# Patient Record
Sex: Female | Born: 1990 | Race: White | Hispanic: No | Marital: Single | Smoking: Never smoker
Health system: Southern US, Community
[De-identification: ages and names within clinical notes are randomized; demographics above are authoritative.]

## PROBLEM LIST (undated history)

## (undated) DIAGNOSIS — T7840XA Allergy, unspecified, initial encounter: Secondary | ICD-10-CM

## (undated) DIAGNOSIS — I10 Essential (primary) hypertension: Secondary | ICD-10-CM

## (undated) HISTORY — PX: TONSILLECTOMY AND ADENOIDECTOMY: SUR1326

## (undated) HISTORY — PX: HERNIA REPAIR: SHX51

## (undated) HISTORY — DX: Essential (primary) hypertension: I10

## (undated) HISTORY — DX: Allergy, unspecified, initial encounter: T78.40XA

---
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
## 2016-01-21 LAB — HM PAP SMEAR

## 2016-12-02 ENCOUNTER — Ambulatory Visit (INDEPENDENT_AMBULATORY_CARE_PROVIDER_SITE_OTHER): Payer: Self-pay | Admitting: Physician Assistant

## 2016-12-02 VITALS — BP 114/84 | HR 99 | Temp 98.1°F | Resp 16 | Ht 64.0 in | Wt 283.4 lb

## 2016-12-02 DIAGNOSIS — R10812 Left upper quadrant abdominal tenderness: Secondary | ICD-10-CM

## 2016-12-02 DIAGNOSIS — F418 Other specified anxiety disorders: Secondary | ICD-10-CM

## 2016-12-02 LAB — POCT URINALYSIS DIP (MANUAL ENTRY)
Bilirubin, UA: NEGATIVE
Glucose, UA: NEGATIVE
Ketones, POC UA: NEGATIVE
Nitrite, UA: NEGATIVE
Spec Grav, UA: >=1.03
Urobilinogen, UA: 0.2
pH, UA: 5.5

## 2016-12-02 LAB — POCT CBC
Granulocyte percent: 60.8 %G (ref 37–80)
HCT, POC: 43.5 % (ref 37.7–47.9)
Hemoglobin: 15 g/dL (ref 12.2–16.2)
Lymph, poc: 3.7 — AB (ref 0.6–3.4)
MCH, POC: 27.6 pg (ref 27–31.2)
MCHC: 34.6 g/dL (ref 31.8–35.4)
MCV: 79.9 fL — AB (ref 80–97)
MID (cbc): 0.5 (ref 0–0.9)
MPV: 9 fL (ref 0–99.8)
POC Granulocyte: 6.4 (ref 2–6.9)
POC LYMPH PERCENT: 34.6 %L (ref 10–50)
POC MID %: 4.6 %M (ref 0–12)
Platelet Count, POC: 195 10*3/uL (ref 142–424)
RBC: 5.44 M/uL (ref 4.04–5.48)
RDW, POC: 12.5 %
WBC: 10.6 10*3/uL — AB (ref 4.6–10.2)

## 2016-12-02 LAB — POC MICROSCOPIC URINALYSIS (UMFC): Mucus: ABSENT

## 2016-12-02 LAB — POCT URINE PREGNANCY: Preg Test, Ur: NEGATIVE

## 2016-12-02 NOTE — Progress Notes (Signed)
12/05/2016 8:33 AM   DOB: Oct 28, 1991 / MRN: [id number]  SUBJECTIVE:  Debra Pacheco is a 26 y.o. female presenting for pain in her left lower quadrant that started 6 months ago. She also reports a lump in the skin just to the right of her umbilicus. These problems started at the same time. has roughly 1 stool daily and reports this is normal for her. She denies any acid reflux. She does have a history of HTN as a child however this resolved. She denies a personal history of diabetes. Denies difficulty swallowing.  She is sexually active with a female and is not trying to prevent pregnancy.    She has No Known Allergies.   She  has a past medical history of Hypertension.    She  reports that she has never smoked. She has never used smokeless tobacco. She reports that she does not drink alcohol or use drugs. She  has no sexual activity history on file. The patient  has a past surgical history that includes Hernia repair and Tonsillectomy and adenoidectomy.  Her family history includes Hyperlipidemia in her father and mother.  Review of Systems  Constitutional: Negative for fever.  HENT: Negative for sore throat.   Respiratory: Negative for cough.   Cardiovascular: Negative for chest pain.  Gastrointestinal: Positive for abdominal pain. Negative for blood in stool, constipation, diarrhea, heartburn, melena, nausea and vomiting.  Genitourinary: Negative for dysuria, flank pain, frequency, hematuria and urgency.  Neurological: Negative for dizziness.  Psychiatric/Behavioral: The patient is nervous/anxious.     The problem list and medications were reviewed and updated by myself where necessary and exist elsewhere in the encounter.   OBJECTIVE:  BP 114/84   Pulse 99   Temp 98.1 F (36.7 C) (Oral)   Resp 16   Ht '5\' 4"'  (1.626 m)   Wt 283 lb 6.4 oz (128.5 kg)   LMP 11/04/2016   SpO2 96%   BMI 48.65 kg/m   Physical Exam  Constitutional: She is oriented to person, place, and time. She  appears well-developed and well-nourished.  Cardiovascular: Normal rate, regular rhythm and normal heart sounds.   Pulmonary/Chest: Effort normal and breath sounds normal.  Abdominal: There is no hepatosplenomegaly, splenomegaly or hepatomegaly. There is no rigidity, no rebound, no guarding and no CVA tenderness.    Musculoskeletal: Normal range of motion.  Neurological: She is alert and oriented to person, place, and time.  Skin: Skin is warm and dry.  Psychiatric: She has a normal mood and affect.    Results for orders placed or performed in visit on 12/02/16 (from the past 72 hour(s))  CMP14+EGFR     Status: Abnormal   Collection Time: 12/02/16  3:13 PM  Result Value Ref Range   Glucose 81 65 - 99 mg/dL   BUN 12 6 - 20 mg/dL   Creatinine, Ser 0.83 0.57 - 1.00 mg/dL   GFR calc non Af Amer 97 >59 mL/min/1.73   GFR calc Af Amer 112 >59 mL/min/1.73   BUN/Creatinine Ratio 14 9 - 23   Sodium 142 134 - 144 mmol/L   Potassium 3.9 3.5 - 5.2 mmol/L   Chloride 98 96 - 106 mmol/L   CO2 26 18 - 29 mmol/L   Calcium 9.5 8.7 - 10.2 mg/dL   Total Protein 7.9 6.0 - 8.5 g/dL   Albumin 4.6 3.5 - 5.5 g/dL   Globulin, Total 3.3 1.5 - 4.5 g/dL   Albumin/Globulin Ratio 1.4 1.2 - 2.2   Bilirubin Total  0.9 0.0 - 1.2 mg/dL   Alkaline Phosphatase 61 39 - 117 IU/L   AST 31 0 - 40 IU/L   ALT 51 (H) 0 - 32 IU/L  POCT urinalysis dipstick     Status: Abnormal   Collection Time: 12/02/16  3:24 PM  Result Value Ref Range   Color, UA yellow yellow   Clarity, UA cloudy (A) clear   Glucose, UA negative negative   Bilirubin, UA negative negative   Ketones, POC UA negative negative   Spec Grav, UA >=1.030    Blood, UA large (A) negative   pH, UA 5.5    Protein Ur, POC trace (A) negative   Urobilinogen, UA 0.2    Nitrite, UA Negative Negative   Leukocytes, UA Trace (A) Negative  POCT urine pregnancy     Status: None   Collection Time: 12/02/16  3:25 PM  Result Value Ref Range   Preg Test, Ur Negative  Negative  POCT CBC     Status: Abnormal   Collection Time: 12/02/16  3:27 PM  Result Value Ref Range   WBC 10.6 (A) 4.6 - 10.2 K/uL   Lymph, poc 3.7 (A) 0.6 - 3.4   POC LYMPH PERCENT 34.6 10 - 50 %L   MID (cbc) 0.5 0 - 0.9   POC MID % 4.6 0 - 12 %M   POC Granulocyte 6.4 2 - 6.9   Granulocyte percent 60.8 37 - 80 %G   RBC 5.44 4.04 - 5.48 M/uL   Hemoglobin 15.0 12.2 - 16.2 g/dL   HCT, POC 43.5 37.7 - 47.9 %   MCV 79.9 (A) 80 - 97 fL   MCH, POC 27.6 27 - 31.2 pg   MCHC 34.6 31.8 - 35.4 g/dL   RDW, POC 12.5 %   Platelet Count, POC 195 142 - 424 K/uL   MPV 9.0 0 - 99.8 fL  POCT Microscopic Urinalysis ([facility])     Status: Abnormal   Collection Time: 12/02/16  4:14 PM  Result Value Ref Range   WBC,UR,HPF,POC Moderate (A) None WBC/hpf   RBC,UR,HPF,POC Few (A) None RBC/hpf   Bacteria Many (A) None, Too numerous to count   Mucus Absent Absent   Epithelial Cells, UR Per Microscopy Many (A) None, Too numerous to count cells/hpf    No results found.  ASSESSMENT AND PLAN:  Chaquana was seen today for pain left side.  Diagnoses and all orders for this visit:  Left upper quadrant abdominal tenderness, rebound tenderness presence not specified: See urine. She has no dysuria, urgency, frequency, flank pain. The catch was highly contaminated.  She is self pay.  I am not going to culture her urine due to her lack of symptoms. Will CT her her abdomen as she may have a hernia in the right upper quadrant.  With regard to her left upper quadrant, I can not appreciate any problems on her exam.  Platelets normal.   -     CMP14+EGFR -     POCT CBC -     POCT urinalysis dipstick -     POCT urine pregnancy -     POCT Microscopic Urinalysis ([facility]) -     CT Abdomen Pelvis W Contrast; Future  Anxiety about health: I have tried to reassure her regarding her findings today however I'm afraid I was unsuccessful.  She is extremely worried.  Will make her aware of CT abdomen pelvis ASAP. If negative will bring  her back in for treatment of anxiety.  The patient is advised to call or return to clinic if she does not see an improvement in symptoms, or to seek the care of the closest emergency department if she worsens with the above plan.   Philis Fendt, MHS, PA-C [facility] 12/05/2016 8:33 AM

## 2016-12-03 ENCOUNTER — Telehealth: Payer: Self-pay

## 2016-12-03 LAB — CMP14+EGFR
ALT: 51 IU/L — ABNORMAL HIGH (ref 0–32)
AST: 31 IU/L (ref 0–40)
Albumin/Globulin Ratio: 1.4 (ref 1.2–2.2)
Albumin: 4.6 g/dL (ref 3.5–5.5)
Alkaline Phosphatase: 61 IU/L (ref 39–117)
BUN/Creatinine Ratio: 14 (ref 9–23)
BUN: 12 mg/dL (ref 6–20)
Bilirubin Total: 0.9 mg/dL (ref 0.0–1.2)
CO2: 26 mmol/L (ref 18–29)
Calcium: 9.5 mg/dL (ref 8.7–10.2)
Chloride: 98 mmol/L (ref 96–106)
Creatinine, Ser: 0.83 mg/dL (ref 0.57–1.00)
GFR calc Af Amer: 112 mL/min/{1.73_m2} (ref >59)
GFR calc non Af Amer: 97 mL/min/{1.73_m2} (ref >59)
Globulin, Total: 3.3 g/dL (ref 1.5–4.5)
Glucose: 81 mg/dL (ref 65–99)
Potassium: 3.9 mmol/L (ref 3.5–5.2)
Sodium: 142 mmol/L (ref 134–144)
Total Protein: 7.9 g/dL (ref 6.0–8.5)

## 2016-12-03 NOTE — Telephone Encounter (Signed)
Pt would like to know from Debra BostonMichael Clark if she needs to have IV dye, she is already going to drink 2 bottles of solution for her CT scan. Pt really does not like needles. Please advise at [phone]

## 2016-12-03 NOTE — Progress Notes (Signed)
Please add on acute hepatitis panel. CT abdomen pending. Aside from mild elevation in ALT labs look great. Deliah BostonMichael Clark, MS, PA-C 3:10 PM, 12/03/2016

## 2016-12-03 NOTE — Telephone Encounter (Signed)
Informed patient if she was given oral solution she will not need IV dye. I will f/u with [facility] for clarification

## 2016-12-08 ENCOUNTER — Other Ambulatory Visit: Payer: Self-pay

## 2016-12-10 ENCOUNTER — Ambulatory Visit
Admission: RE | Admit: 2016-12-10 | Discharge: 2016-12-10 | Disposition: A | Discharge status: Home / Self Care | Payer: No Typology Code available for payment source | Source: Ambulatory Visit | Attending: Physician Assistant | Admitting: Physician Assistant

## 2016-12-10 DIAGNOSIS — R10812 Left upper quadrant abdominal tenderness: Secondary | ICD-10-CM

## 2016-12-12 NOTE — Progress Notes (Signed)
Let's bring her back in for the hep panel. Blood draw only. Deliah BostonMichael Clark, MS, PA-C 4:38 PM, 12/12/2016

## 2017-03-02 IMAGING — CT CT ABD-PELV W/O CM
2 of 4 series · 16 of 46 positions shown, 18 images · non-contrast
Comparison: Report from abdominal ultrasound from 10/27/2001

CLINICAL DATA: Abdominal pain.  Increased ALT and AST for 7 months.

EXAM:
CT ABDOMEN AND PELVIS WITHOUT CONTRAST
TECHNIQUE: Multidetector CT imaging of the abdomen and pelvis was performed
following the standard protocol without IV contrast.

[Series 2: abd/pelvis w/(date) · axial · 0.91mm/px · z∈[+704,+1184]mm · 13 of 106 slices shown, 15 images]
[im 5/106  soft-tissue]
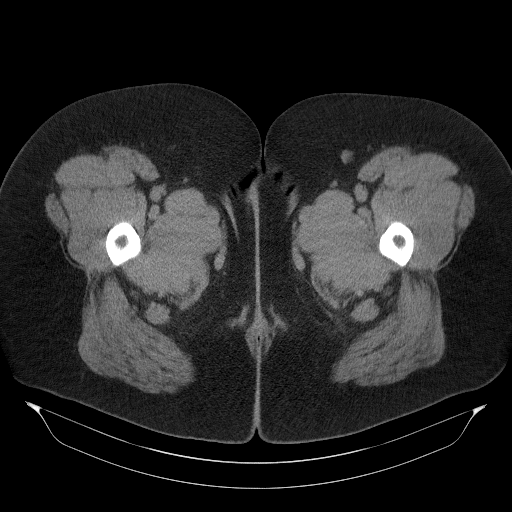
[im 5/106  bone]
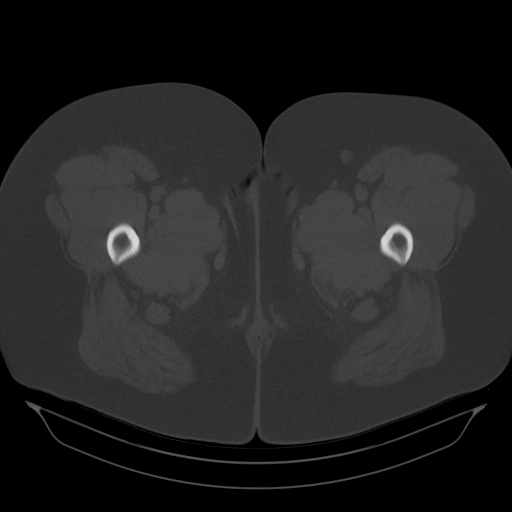
[im 14/106  soft-tissue]
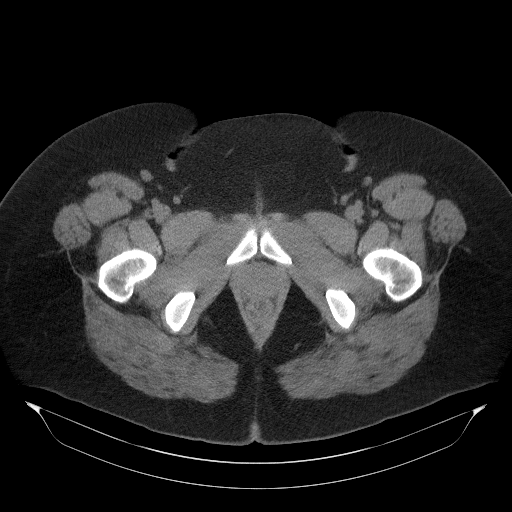
[im 22/106  soft-tissue]
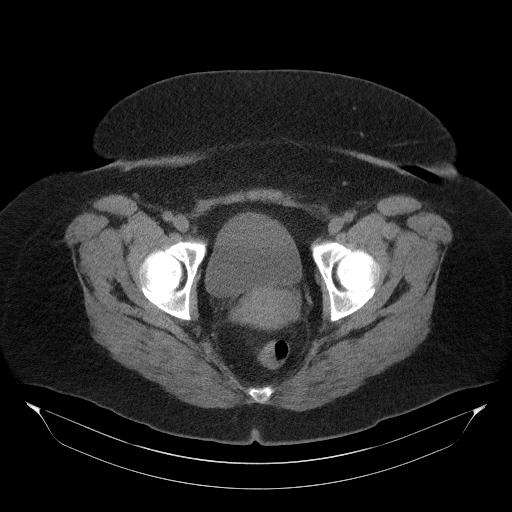
[im 31/106  soft-tissue]
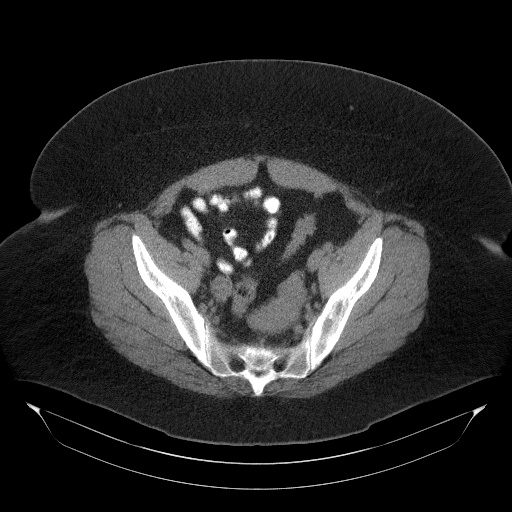
[im 36/106  soft-tissue]
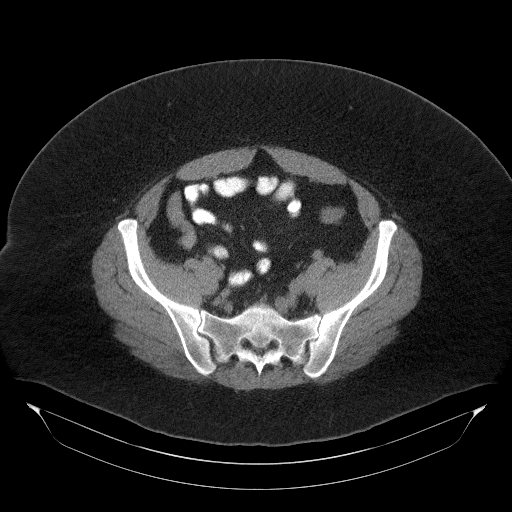
[im 44/106  soft-tissue]
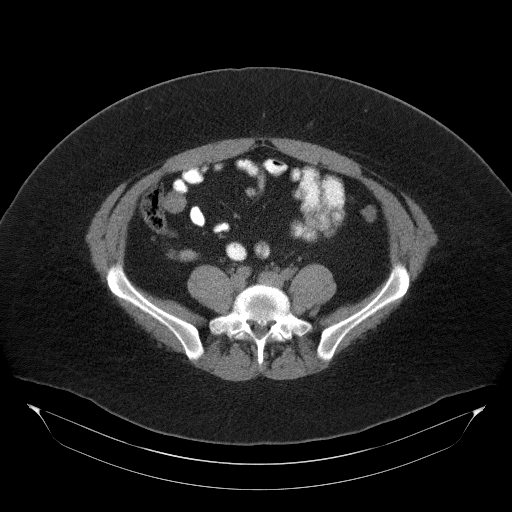
[im 53/106  soft-tissue]
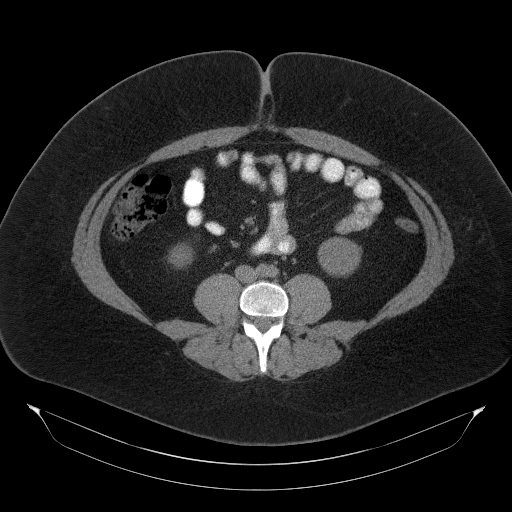
[im 62/106  soft-tissue]
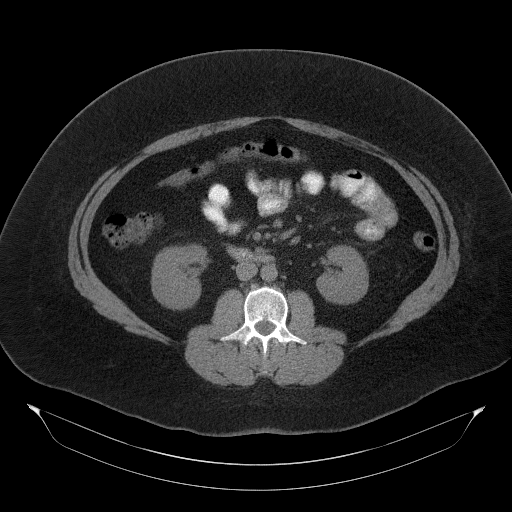
[im 71/106  soft-tissue]
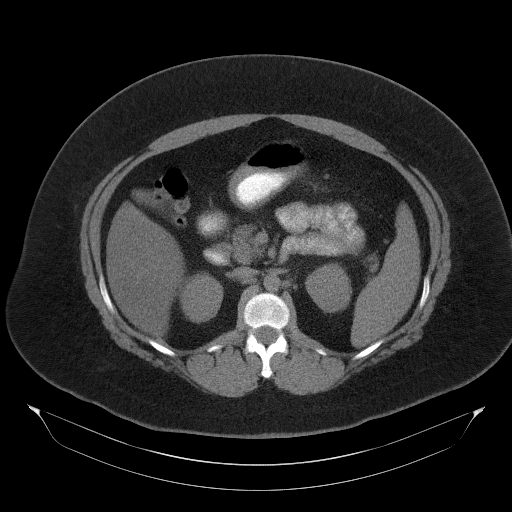
[im 71/106  bone]
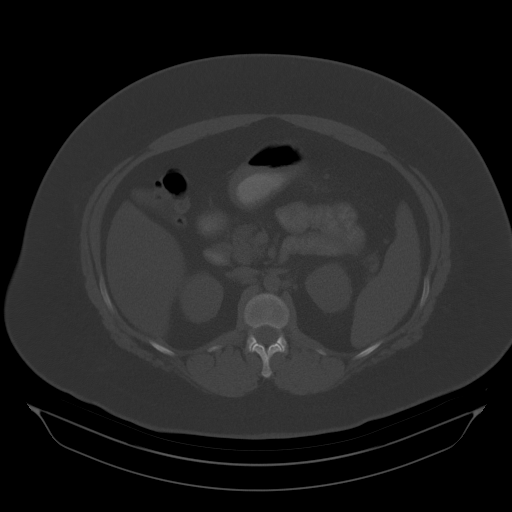
[im 75/106  soft-tissue]
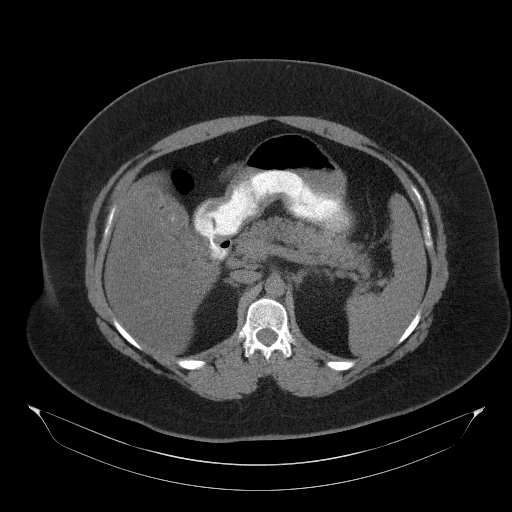
[im 84/106  soft-tissue]
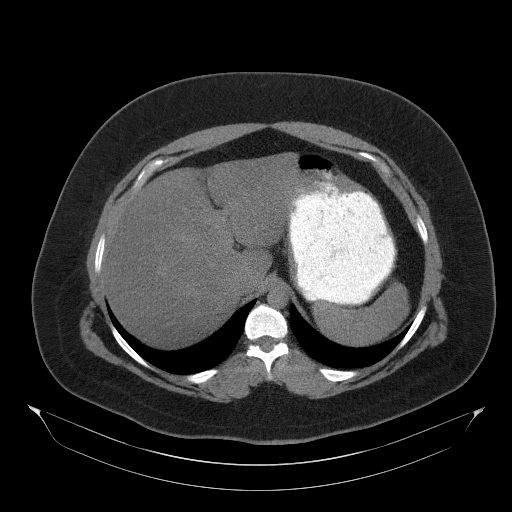
[im 92/106  soft-tissue]
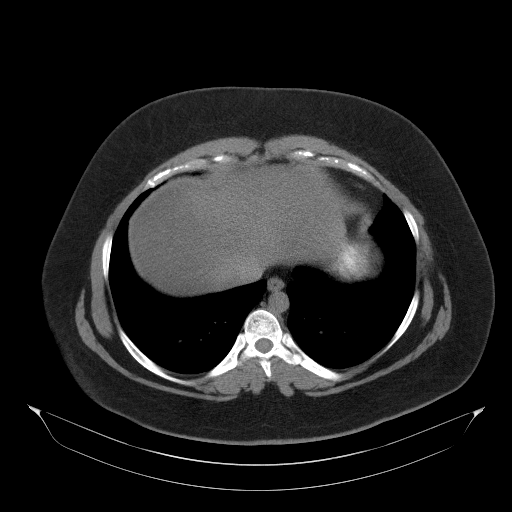
[im 101/106  soft-tissue]
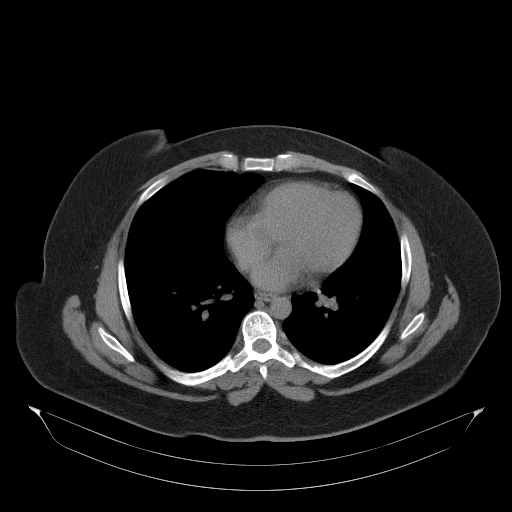

[Series 3: cor · coronal · 0.87mm/px · 3 of 116 slices shown]
[im 39/116  soft-tissue]
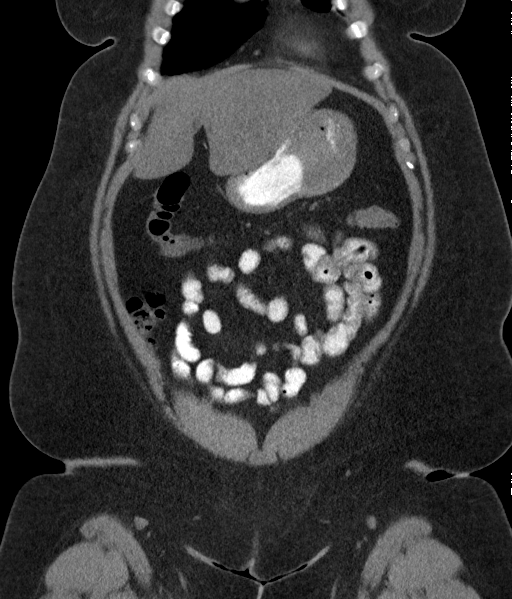
[im 52/116  soft-tissue]
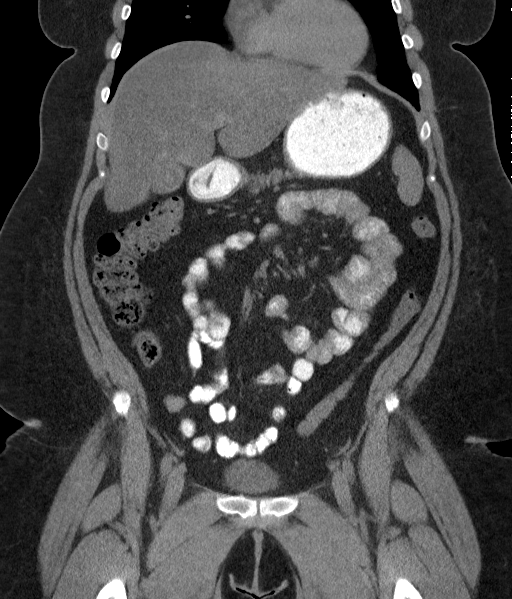
[im 64/116  soft-tissue]
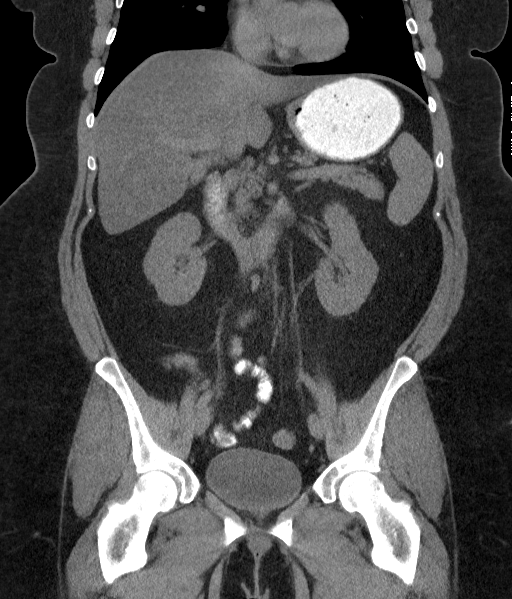

[16 of 46 positions shown; findings below may reference images not displayed]

FINDINGS: Lower chest: Unremarkable

Hepatobiliary: Diffuse hepatic steatosis. The gallbladder is filled
with gallstones measuring up to 2.4 cm in diameter. No biliary
dilatation is appreciated.

Pancreas: Unremarkable

Spleen: Unremarkable

Adrenals/Urinary Tract: Unremarkable

Stomach/Bowel: Unremarkable

Vascular/Lymphatic: Small retroperitoneal and porta hepatis lymph
nodes are not pathologically enlarged by size criteria. For example
a portacaval node measures 1.2 cm in short axis. Small mesenteric
nodes are present and there is a mildly prominent pericecal lymph
node at 1.3 cm in short axis.

Reproductive: Unremarkable

Other: No supplemental non-categorized findings.

Musculoskeletal: Transitional L5 vertebra. Disc bulge at L4-5. Small
umbilical hernia contains adipose tissue.
IMPRESSION: 1. The gallbladder is filled with gallstones, the largest measuring
up to 2.4 cm in diameter.
2. Mildly prominent pericecal lymph node is nonspecific. The
appendix appears normal.
3. Diffuse hepatic steatosis.
4. Disc bulge at L4-5 without impingement. Transitional L5 vertebra.

## 2017-06-27 ENCOUNTER — Telehealth: Payer: Self-pay

## 2017-06-27 NOTE — Telephone Encounter (Signed)
Pre visit call completed 

## 2017-06-27 NOTE — Telephone Encounter (Signed)
Pre visit call made to patient. Left message for return call. 

## 2017-06-29 ENCOUNTER — Ambulatory Visit (INDEPENDENT_AMBULATORY_CARE_PROVIDER_SITE_OTHER): Payer: Self-pay | Admitting: Family Medicine

## 2017-06-29 ENCOUNTER — Encounter: Payer: Self-pay | Admitting: Family Medicine

## 2017-06-29 VITALS — BP 110/84 | HR 85 | Temp 98.7°F | Ht 64.0 in | Wt 251.4 lb

## 2017-06-29 DIAGNOSIS — F411 Generalized anxiety disorder: Secondary | ICD-10-CM

## 2017-06-29 DIAGNOSIS — K8020 Calculus of gallbladder without cholecystitis without obstruction: Secondary | ICD-10-CM

## 2017-06-29 NOTE — Progress Notes (Signed)
Chief Complaint  Patient presents with  . Establish Care    pt want to discuss recent Abd CT scan-also want to discuss anxiety        New Patient Visit SUBJECTIVE: HPI: Debra Pacheco is an 26 y.o.female who is being seen for establishing care.  The patient was previously seen at a [facility] center.    She would like to review a CT scan she had in Jan. It showed gallstones. She has been having right upper quadrant pain after she eats fatty/oily foods. She is completely cut meat out of her diet and is eating vegan. She has lost some weight, however whenever she has anything with even a small amount of fat, she notices pain. She is strongly against the idea of surgery if she can help it.  Anxiety Has hx of anxiety. Mom also has anxiety. Her health issues and weight have contributed to her issue. She has never been on medicine in it before. The patient would like to avoid medicine if possible. Her mother is on a lot of medicine and she does not want to become like that. She has been using some natural Hemp oil that has been helpful. She does not follow with a counselor or psychologist. She denies any self medication other than the hemp oil. No SI or HI.  No Known Allergies  Past Medical History:  Diagnosis Date  . Allergy   . Hypertension    per pt does not currently have   Past Surgical History:  Procedure Laterality Date  . HERNIA REPAIR    . TONSILLECTOMY AND ADENOIDECTOMY     Social History   Social History  . Marital status: Single   Social History Main Topics  . Smoking status: Never Smoker  . Smokeless tobacco: Never Used  . Alcohol use No  . Drug use: No   Family History  Problem Relation Age of Onset  . Hyperlipidemia Mother   . Thyroid disease Mother   . Depression Mother   . Anxiety disorder Mother   . Insomnia Mother   . Hyperlipidemia Father   . Arthritis Maternal Grandmother   . Diabetes Maternal Grandfather    Takes no medications routinely.  Patient's last  menstrual period was 06/04/2017 (approximate).  ROS GI: As noted in HPI  Psych: Denies SI or HI   OBJECTIVE: BP 110/84 (BP Location: Left Arm, Patient Position: Sitting, Cuff Size: Normal)   Pulse 85   Temp 98.7 F (37.1 C) (Oral)   Ht 5\' 4"  (1.626 m)   Wt 251 lb 6.4 oz (114 kg)   LMP 06/04/2017 (Approximate)   SpO2 99%   BMI 43.15 kg/m   Constitutional: -  VS reviewed -  Well developed, well nourished, appears stated age -  No apparent distress  Psychiatric: -  Oriented to person, place, and time -  Memory intact -  Affect and mood normal -  Fluent conversation, good eye contact -  Judgment and insight age appropriate  Eye: -  Conjunctivae clear, no discharge -  Pupils symmetric, round, reactive to light  ENMT: -  MMM    Pharynx moist, no exudate, no erythema  Neck: -  No gross swelling, no palpable masses -  Thyroid midline, not enlarged, mobile, no palpable masses  Cardiovascular: -  RRR -  No LE edema  Respiratory: -  Normal respiratory effort, no accessory muscle use, no retraction -  Breath sounds equal, no wheezes, no ronchi, no crackles  Gastrointestinal: -  Bowel  sounds normal -  TTP in RUQ, no distention, no guarding, no masses  Neurological:  -  CN II - XII grossly intact -  Sensation grossly intact to light touch, equal bilaterally  Musculoskeletal: -  No clubbing, no cyanosis -  Gait normal  Skin: -  No significant lesion on inspection -  Warm and dry to palpation   ASSESSMENT/PLAN: GAD (generalized anxiety disorder)  Gall stones - Plan: US Abdomen Limited RUQ  Patient instructed to sign release of records form from her previous PCP. We will see how much the ultrasound is. She will think about seeing a general surgeon versus continuing w lifestyle modifications. Number for counseling given. She will see about cost.  Patient should return in 6 weeks to recheck anxiety. The patient voiced understanding and agreement to the plan.  Greater than 38  minutes were spent face to face with the patient with greater than 50% of this time spent counseling on options for biliary colic, etiology of anxiety, options for treating anxiety, and follow-up management.   Jilda Roche [place], DO 06/29/17  12:24 PM

## 2017-06-29 NOTE — Patient Instructions (Addendum)
Please consider counseling. The medical literature and evidence-based guidelines support it. Contact [phone] to schedule an appointment or inquire about cost/insurance coverage.  Don't Google symptoms if it affects your anxiety.  Aim to do some physical exertion for 150 minutes per week. This is typically divided into 5 days per week, 30 minutes per day. The activity should be enough to get your heart rate up. Anything is better than nothing if you have time constraints. Don't put your life on hold for your small herniation.  If you do not hear anything about your ultrasound in the next week, call our office and ask for an update.

## 2017-06-30 ENCOUNTER — Ambulatory Visit (HOSPITAL_BASED_OUTPATIENT_CLINIC_OR_DEPARTMENT_OTHER)
Admission: RE | Admit: 2017-06-30 | Discharge: 2017-06-30 | Disposition: A | Discharge status: Home / Self Care | Payer: Self-pay | Source: Ambulatory Visit | Attending: Family Medicine | Admitting: Family Medicine

## 2017-06-30 DIAGNOSIS — K8020 Calculus of gallbladder without cholecystitis without obstruction: Secondary | ICD-10-CM | POA: Insufficient documentation

## 2017-08-02 ENCOUNTER — Telehealth: Payer: Self-pay | Admitting: Family Medicine

## 2017-08-02 NOTE — Telephone Encounter (Signed)
Caller name: Relation to OZ:HYQMpt:self Call back [phone] Pharmacy:  Reason for call: pt is requesting a call back regarding a bill she received from her new pt appt on 06/29/17. I explained to the pt the breakdown of her bill, informed her that she was billed a level 4. I provided her the breakdown, pt refused to listen and states that Dr. Carmelia RollerWendling should have informed her that if he discussed anything else with her that it would be a charge. Pt states she only wanted to know what to do about the gall stones, and that Dr. Carmelia RollerWendling mentioned the anxiety. Pt states she could have went to [place] and it would have been cheaper and she was under the impression that she would only be billed $72, however pt was informed at time of check in that additional charges could apply if the if the doctor requested any labs, testing, etc. Pt would like a call back.

## 2017-12-02 IMAGING — US US ABDOMEN LIMITED
1 series · 14 of 25 positions shown · non-contrast
Comparison: None.

CLINICAL DATA: Right upper quadrant pain

EXAM:
ULTRASOUND ABDOMEN LIMITED RIGHT UPPER QUADRANT

[Series 1: us abdomen limited · 0.22mm/px · 14 of 44 slices shown]
[im 1/44]
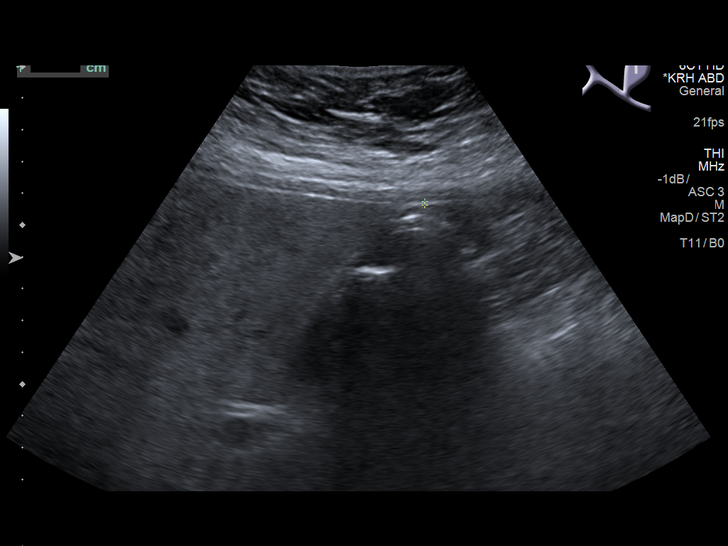
[im 4/44]
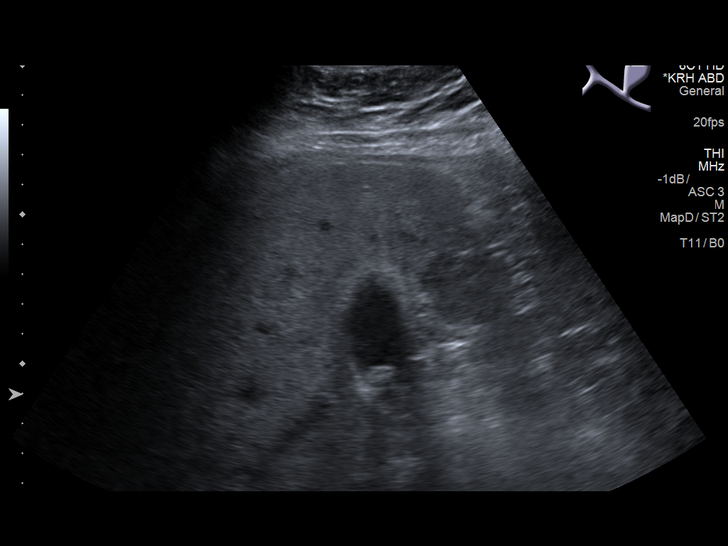
[im 8/44]
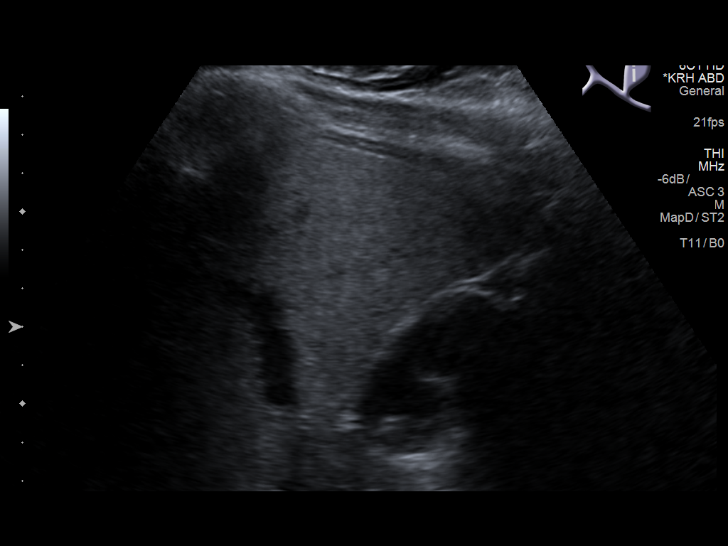
[im 11/44]
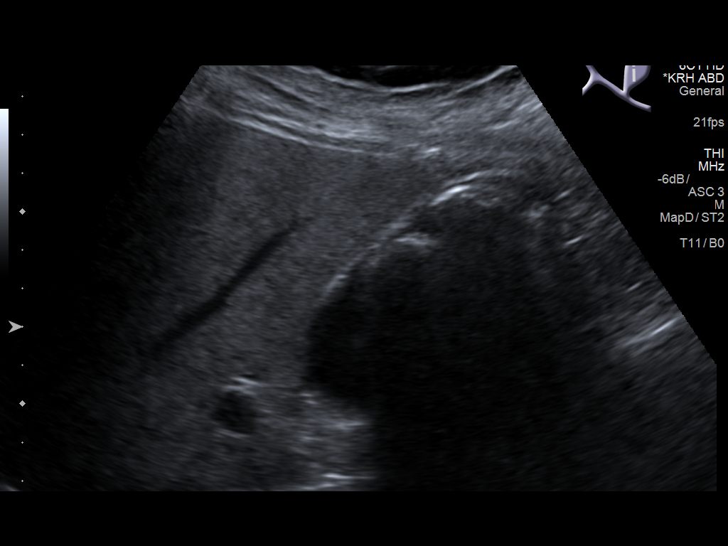
[im 15/44]
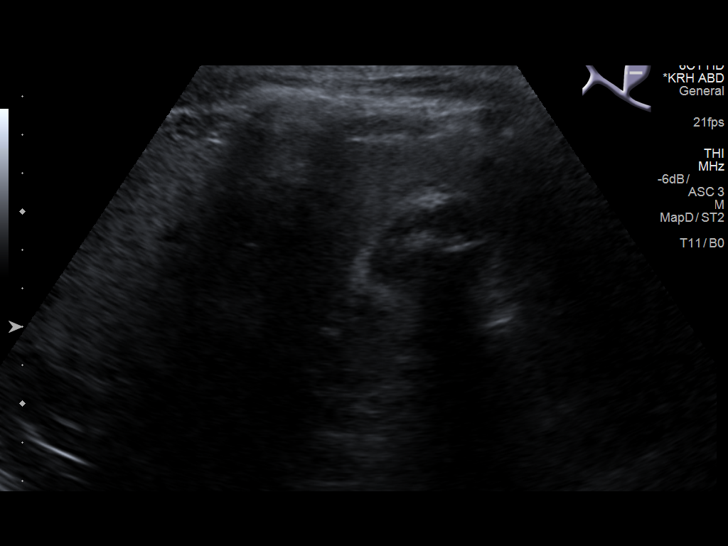
[im 17/44]
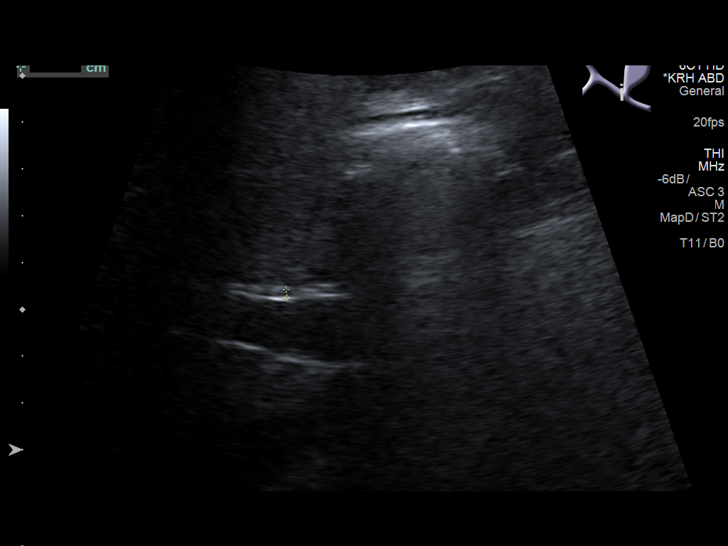
[im 20/44]
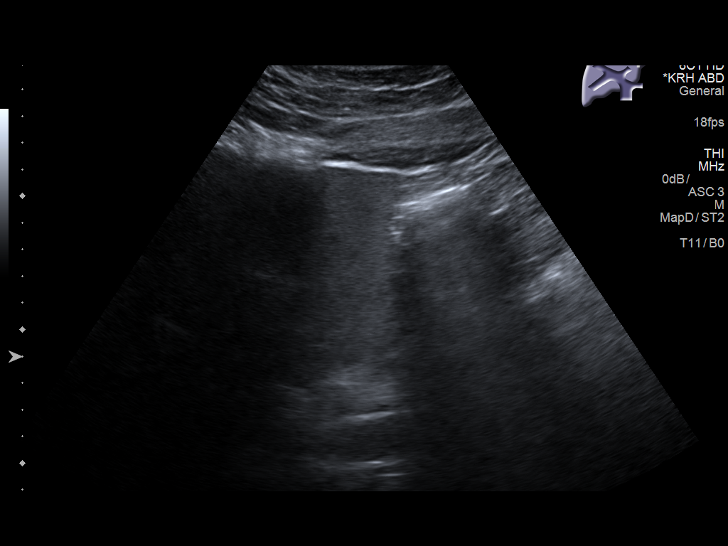
[im 24/44]
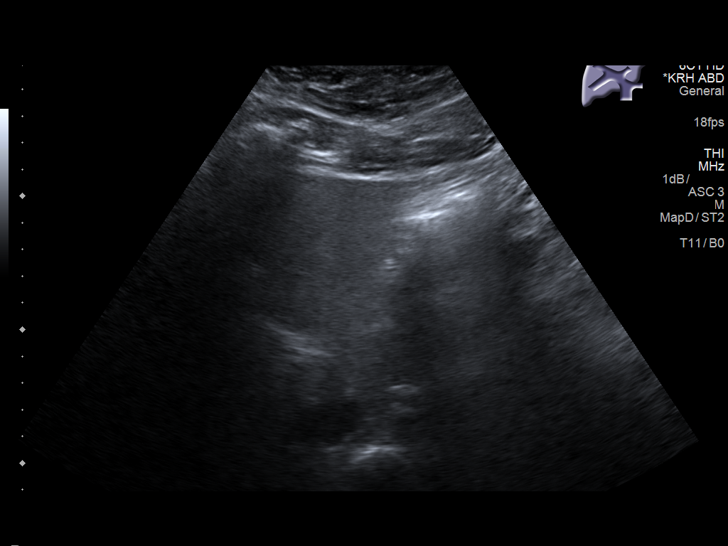
[im 27/44]
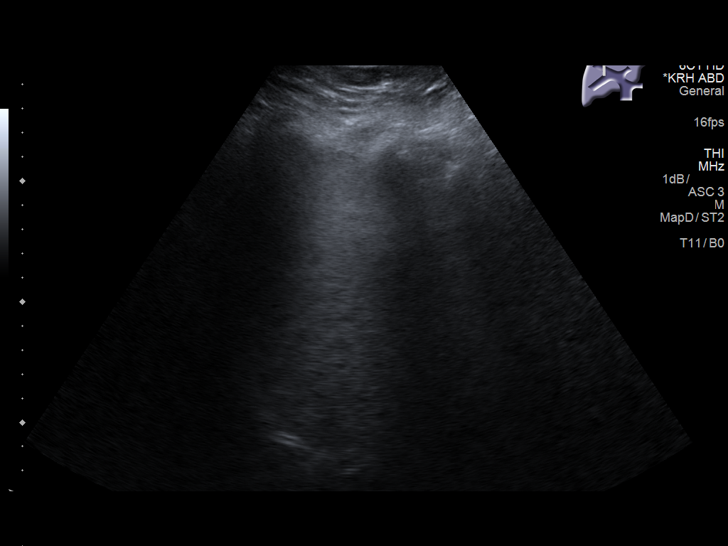
[im 29/44]
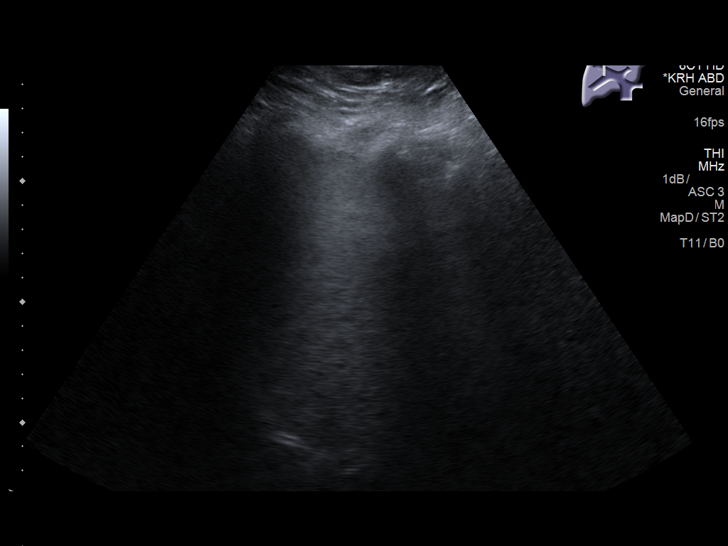
[im 33/44]
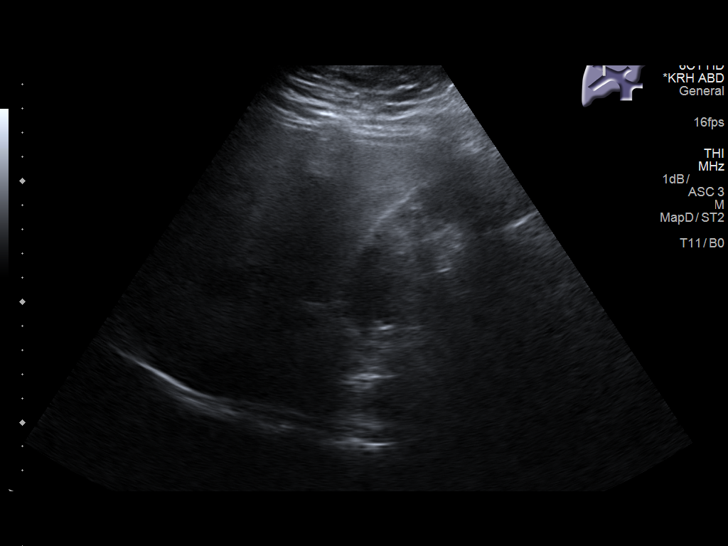
[im 36/44]
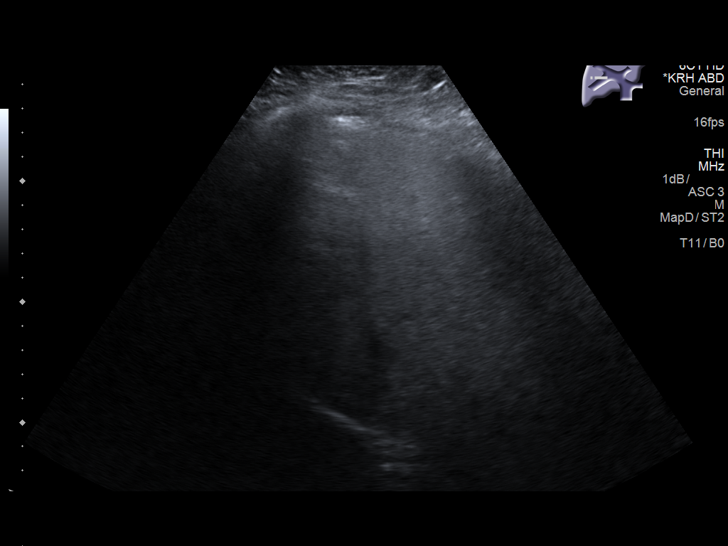
[im 40/44]
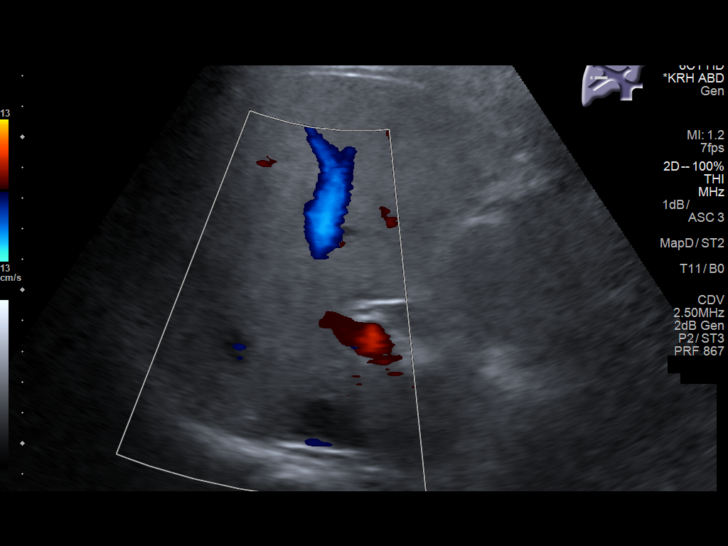
[im 44/44]
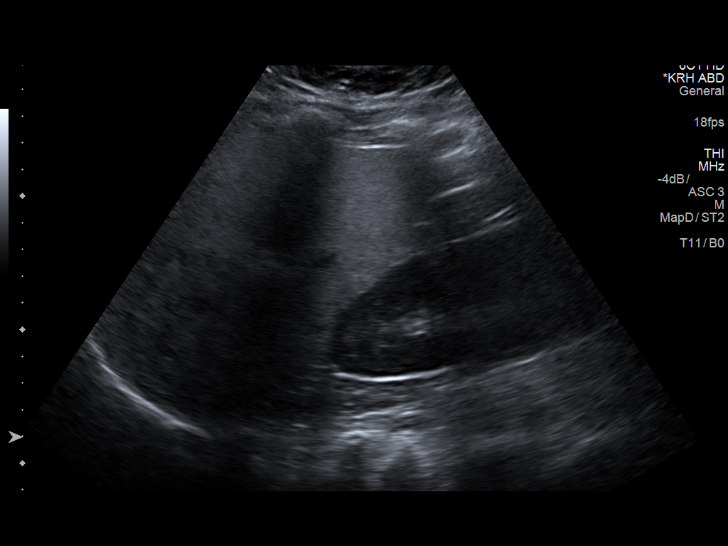

[14 of 25 positions shown; findings below may reference images not displayed]

FINDINGS: Gallbladder:

Gallstones are identified without significant wall thickening or
pericholecystic fluid. Negative sonographic Murphy sign is noted.

Common bile duct:

Diameter: 1.7 mm

Liver:

No focal lesion identified. Within normal limits in parenchymal
echogenicity.
IMPRESSION: Cholelithiasis without complicating factors.

## 2018-01-23 ENCOUNTER — Telehealth: Payer: Self-pay | Admitting: Physician Assistant

## 2018-01-23 ENCOUNTER — Ambulatory Visit: Payer: Self-pay | Admitting: Physician Assistant

## 2018-01-24 ENCOUNTER — Ambulatory Visit: Payer: Medicaid Other | Admitting: Emergency Medicine

## 2018-01-25 ENCOUNTER — Ambulatory Visit: Payer: Self-pay | Admitting: Family Medicine

## 2022-09-20 ENCOUNTER — Emergency Department (HOSPITAL_BASED_OUTPATIENT_CLINIC_OR_DEPARTMENT_OTHER): Payer: 59

## 2022-09-20 ENCOUNTER — Encounter (HOSPITAL_BASED_OUTPATIENT_CLINIC_OR_DEPARTMENT_OTHER): Payer: Self-pay | Admitting: Emergency Medicine

## 2022-09-20 ENCOUNTER — Emergency Department (HOSPITAL_BASED_OUTPATIENT_CLINIC_OR_DEPARTMENT_OTHER)
Admission: EM | Admit: 2022-09-20 | Discharge: 2022-09-20 | Disposition: A | Discharge status: Home / Self Care | Payer: 59 | Attending: Emergency Medicine | Admitting: Emergency Medicine

## 2022-09-20 ENCOUNTER — Other Ambulatory Visit: Payer: Self-pay

## 2022-09-20 DIAGNOSIS — D72829 Elevated white blood cell count, unspecified: Secondary | ICD-10-CM | POA: Diagnosis not present

## 2022-09-20 DIAGNOSIS — R509 Fever, unspecified: Secondary | ICD-10-CM | POA: Insufficient documentation

## 2022-09-20 DIAGNOSIS — R110 Nausea: Secondary | ICD-10-CM | POA: Diagnosis not present

## 2022-09-20 DIAGNOSIS — R7981 Abnormal blood-gas level: Secondary | ICD-10-CM | POA: Diagnosis not present

## 2022-09-20 DIAGNOSIS — R55 Syncope and collapse: Secondary | ICD-10-CM | POA: Insufficient documentation

## 2022-09-20 DIAGNOSIS — R000 Tachycardia, unspecified: Secondary | ICD-10-CM | POA: Insufficient documentation

## 2022-09-20 DIAGNOSIS — R197 Diarrhea, unspecified: Secondary | ICD-10-CM | POA: Diagnosis not present

## 2022-09-20 DIAGNOSIS — Z20822 Contact with and (suspected) exposure to covid-19: Secondary | ICD-10-CM | POA: Insufficient documentation

## 2022-09-20 LAB — COMPREHENSIVE METABOLIC PANEL
ALT: 24 U/L (ref 0–44)
AST: 21 U/L (ref 15–41)
Albumin: 3.9 g/dL (ref 3.5–5.0)
Alkaline Phosphatase: 61 U/L (ref 38–126)
Anion gap: 8 (ref 5–15)
BUN: 15 mg/dL (ref 6–20)
CO2: 19 mmol/L — ABNORMAL LOW (ref 22–32)
Calcium: 8.2 mg/dL — ABNORMAL LOW (ref 8.9–10.3)
Chloride: 106 mmol/L (ref 98–111)
Creatinine, Ser: 0.67 mg/dL (ref 0.44–1.00)
GFR, Estimated: >60 mL/min (ref >60)
Glucose, Bld: 113 mg/dL — ABNORMAL HIGH (ref 70–99)
Potassium: 4 mmol/L (ref 3.5–5.1)
Sodium: 133 mmol/L — ABNORMAL LOW (ref 135–145)
Total Bilirubin: 1 mg/dL (ref 0.3–1.2)
Total Protein: 7.7 g/dL (ref 6.5–8.1)

## 2022-09-20 LAB — CBC WITH DIFFERENTIAL/PLATELET
Abs Immature Granulocytes: 0.07 10*3/uL (ref 0.00–0.07)
Basophils Absolute: 0 10*3/uL (ref 0.0–0.1)
Basophils Relative: 0 %
Eosinophils Absolute: 0.1 10*3/uL (ref 0.0–0.5)
Eosinophils Relative: 1 %
HCT: 46.9 % — ABNORMAL HIGH (ref 36.0–46.0)
Hemoglobin: 14.8 g/dL (ref 12.0–15.0)
Immature Granulocytes: 1 %
Lymphocytes Relative: 6 %
Lymphs Abs: 0.8 10*3/uL (ref 0.7–4.0)
MCH: 25.9 pg — ABNORMAL LOW (ref 26.0–34.0)
MCHC: 31.6 g/dL (ref 30.0–36.0)
MCV: 82 fL (ref 80.0–100.0)
Monocytes Absolute: 0.7 10*3/uL (ref 0.1–1.0)
Monocytes Relative: 6 %
Neutro Abs: 10.5 10*3/uL — ABNORMAL HIGH (ref 1.7–7.7)
Neutrophils Relative %: 86 %
Platelets: 150 10*3/uL (ref 150–400)
RBC: 5.72 MIL/uL — ABNORMAL HIGH (ref 3.87–5.11)
RDW: 13.2 % (ref 11.5–15.5)
WBC: 12.2 10*3/uL — ABNORMAL HIGH (ref 4.0–10.5)
nRBC: 0 % (ref 0.0–0.2)

## 2022-09-20 LAB — RESP PANEL BY RT-PCR (FLU A&B, COVID) ARPGX2
Influenza A by PCR: NEGATIVE
Influenza B by PCR: NEGATIVE
SARS Coronavirus 2 by RT PCR: NEGATIVE

## 2022-09-20 LAB — URINALYSIS, ROUTINE W REFLEX MICROSCOPIC
Bilirubin Urine: NEGATIVE
Glucose, UA: NEGATIVE mg/dL
Hgb urine dipstick: NEGATIVE
Ketones, ur: NEGATIVE mg/dL
Nitrite: NEGATIVE
Protein, ur: 30 mg/dL — AB
Specific Gravity, Urine: 1.025 (ref 1.005–1.030)
pH: 5.5 (ref 5.0–8.0)

## 2022-09-20 LAB — URINALYSIS, MICROSCOPIC (REFLEX)

## 2022-09-20 LAB — APTT: aPTT: 30 seconds (ref 24–36)

## 2022-09-20 LAB — LACTIC ACID, PLASMA: Lactic Acid, Venous: 1.2 mmol/L (ref 0.5–1.9)

## 2022-09-20 LAB — PROTIME-INR
INR: 1.1 (ref 0.8–1.2)
Prothrombin Time: 13.9 seconds (ref 11.4–15.2)

## 2022-09-20 LAB — PREGNANCY, URINE: Preg Test, Ur: NEGATIVE

## 2022-09-20 MED ORDER — ONDANSETRON HCL 4 MG/2ML IJ SOLN
4.0000 mg | Freq: Once | INTRAMUSCULAR | Status: AC
  Administered 2022-09-20: 4 mg via INTRAVENOUS
  Filled 2022-09-20: qty 2

## 2022-09-20 MED ORDER — ONDANSETRON HCL 4 MG PO TABS: 4.0000 mg | ORAL_TABLET | Freq: Three times a day (TID) | ORAL | 0 refills | Status: AC | PRN

## 2022-09-20 MED ORDER — ACETAMINOPHEN 325 MG PO TABS
650.0000 mg | ORAL_TABLET | Freq: Once | ORAL | Status: AC
  Administered 2022-09-20: 650 mg via ORAL
  Filled 2022-09-20: qty 2

## 2022-09-20 MED ORDER — LACTATED RINGERS IV BOLUS (SEPSIS)
1000.0000 mL | Freq: Once | INTRAVENOUS | Status: AC
  Administered 2022-09-20: 1000 mL via INTRAVENOUS

## 2022-09-20 NOTE — ED Triage Notes (Signed)
Pt arrives pov, slow gait, reports abdominal pain with diarrhea yesterday, Fatigue, light headed  and nausea today. Reports near syncope today when taking shower.

## 2022-09-20 NOTE — ED Provider Notes (Signed)
[facility] EMERGENCY DEPARTMENT Provider Note   CSN: [id number] Arrival date & time: 09/20/22  1744     History {Add pertinent medical, surgical, social history, OB history to HPI:1} Chief Complaint  Patient presents with   Fever    Debra Pacheco is a 31 y.o. female.  She has no significant past medical history.  She had 7 episodes of loose stool yesterday and some nausea.  She slept late today when she got up and took a shower she felt very lightheaded like she might pass out.  She denies any abdominal pain or vomiting, no blood in her stool.  No urinary symptoms cough shortness of breath sore throat.  She has tried nothing for her symptoms.  The history is provided by the patient and a relative.  Fever Max temp prior to arrival:  103 Temp source:  Oral Progression:  Unchanged Chronicity:  New Relieved by:  None tried Worsened by:  Nothing Ineffective treatments:  None tried Associated symptoms: diarrhea and nausea   Associated symptoms: no chest pain, no dysuria, no rash and no sore throat   Risk factors: no recent travel and no sick contacts        Home Medications Prior to Admission medications   Not on File      Allergies    Patient has no known allergies.    Review of Systems   Review of Systems  Constitutional:  Positive for fever.  HENT:  Negative for sore throat.   Respiratory:  Negative for shortness of breath.   Cardiovascular:  Negative for chest pain.  Gastrointestinal:  Positive for diarrhea and nausea. Negative for abdominal pain.  Genitourinary:  Negative for dysuria.  Skin:  Negative for rash.  Neurological:  Positive for light-headedness.    Physical Exam Updated Vital Signs BP 130/79 (BP Location: Left Arm)   Pulse (!) 137   Temp (!) 103.1 F (39.5 C) (Oral)   Resp 18   Ht 5\' 4"  (1.626 m)   Wt 117.9 kg   LMP 08/30/2022   SpO2 98%   BMI 44.63 kg/m  Physical Exam Vitals and nursing note reviewed.  Constitutional:       General: She is not in acute distress.    Appearance: Normal appearance. She is well-developed. She is obese.  HENT:     Head: Normocephalic and atraumatic.  Eyes:     Conjunctiva/sclera: Conjunctivae normal.  Cardiovascular:     Rate and Rhythm: Regular rhythm. Tachycardia present.     Heart sounds: No murmur heard. Pulmonary:     Effort: Pulmonary effort is normal. No respiratory distress.     Breath sounds: Normal breath sounds.  Abdominal:     Palpations: Abdomen is soft.     Tenderness: There is no abdominal tenderness. There is no guarding or rebound.  Musculoskeletal:        General: Normal range of motion.     Cervical back: Neck supple.  Skin:    General: Skin is warm and dry.     Capillary Refill: Capillary refill takes less than 2 seconds.  Neurological:     General: No focal deficit present.     Mental Status: She is alert.     ED Results / Procedures / Treatments   Labs (all labs ordered are listed, but only abnormal results are displayed) Labs Reviewed  CULTURE, BLOOD (ROUTINE X 2)  CULTURE, BLOOD (ROUTINE X 2)  URINE CULTURE  RESP PANEL BY RT-PCR (FLU A&B, COVID) ARPGX2  LACTIC ACID, PLASMA  LACTIC ACID, PLASMA  COMPREHENSIVE METABOLIC PANEL  CBC WITH DIFFERENTIAL/PLATELET  PROTIME-INR  APTT  URINALYSIS, ROUTINE W REFLEX MICROSCOPIC  PREGNANCY, URINE    EKG EKG Interpretation  Date/Time:  Monday September 20 2022 18:02:54 EDT Ventricular Rate:  139 PR Interval:  134 QRS Duration: 70 QT Interval:  264 QTC Calculation: 401 R Axis:   96 Text Interpretation: Sinus tachycardia Rightward axis Nonspecific T wave abnormality Abnormal ECG When compared with ECG of 17-Nov-2001 10:36, rate faster and nonspecific t wave abnormality new Confirmed by Meridee Score [phone]) on 09/20/2022 6:04:37 PM  Radiology No results found.  Procedures Procedures  {Document cardiac monitor, telemetry assessment procedure when appropriate:1}  Medications Ordered in  ED Medications  lactated ringers bolus 1,000 mL (has no administration in time range)  acetaminophen (TYLENOL) tablet 650 mg (has no administration in time range)    ED Course/ Medical Decision Making/ A&P                           Medical Decision Making Amount and/or Complexity of Data Reviewed Labs: ordered. Radiology: ordered.  Risk OTC drugs.  Debra Pacheco was evaluated in Emergency Department on 09/20/2022 for the symptoms described in the history of present illness. She was evaluated in the context of the global COVID-19 pandemic, which necessitated consideration that the patient might be at risk for infection with the SARS-CoV-2 virus that causes COVID-19. Institutional protocols and algorithms that pertain to the evaluation of patients at risk for COVID-19 are in a state of rapid change based on information released by regulatory bodies including the CDC and federal and state organizations. These policies and algorithms were followed during the patient's care in the ED. This patient complains of ***; this involves an extensive number of treatment Options and is a complaint that carries with it a high risk of complications and morbidity. The differential includes ***  I ordered, reviewed and interpreted labs, which included *** I ordered medication *** and reviewed PMP when indicated. I ordered imaging studies which included *** and I independently    visualized and interpreted imaging which showed *** Additional history obtained from *** Previous records obtained and reviewed *** I consulted *** and discussed lab and imaging findings and discussed disposition.  Cardiac monitoring reviewed, *** Social determinants considered, *** Critical Interventions: ***  After the interventions stated above, I reevaluated the patient and found *** Admission and further testing considered, ***    {Document critical care time when appropriate:1} {Document review of labs and clinical  decision tools ie heart score, Chads2Vasc2 etc:1}  {Document your independent review of radiology images, and any outside records:1} {Document your discussion with family members, caretakers, and with consultants:1} {Document social determinants of health affecting pt's care:1} {Document your decision making why or why not admission, treatments were needed:1} Final Clinical Impression(s) / ED Diagnoses Final diagnoses:  None    Rx / DC Orders ED Discharge Orders     None

## 2022-09-20 NOTE — ED Notes (Signed)
Successfully fluid challenged

## 2022-09-20 NOTE — Discharge Instructions (Addendum)
You were seen in the emergency department for nausea diarrhea and dizziness.  You had a high fever here.  Your work-up otherwise did not show an obvious explanation for your symptoms.  Please monitor for fevers and use Tylenol and ibuprofen.  We are prescribing you some nausea medication.  Clear liquid diet advance as tolerated.  Follow-up with your regular doctor and return to the emergency department if any worsening or concerning symptoms

## 2022-09-20 NOTE — ED Notes (Signed)
Pt ambulated without assistance to restroom 

## 2022-09-22 LAB — URINE CULTURE

## 2022-09-25 LAB — CULTURE, BLOOD (ROUTINE X 2)
Culture: NO GROWTH
Culture: NO GROWTH
Special Requests: ADEQUATE
Special Requests: ADEQUATE

## 2023-02-24 ENCOUNTER — Encounter (HOSPITAL_BASED_OUTPATIENT_CLINIC_OR_DEPARTMENT_OTHER): Payer: Self-pay

## 2023-02-24 ENCOUNTER — Emergency Department (HOSPITAL_BASED_OUTPATIENT_CLINIC_OR_DEPARTMENT_OTHER)
Admission: EM | Admit: 2023-02-24 | Discharge: 2023-02-24 | Disposition: A | Discharge status: Home / Self Care | Payer: Medicaid Other | Attending: Emergency Medicine | Admitting: Emergency Medicine

## 2023-02-24 ENCOUNTER — Other Ambulatory Visit: Payer: Self-pay

## 2023-02-24 DIAGNOSIS — S0502XA Injury of conjunctiva and corneal abrasion without foreign body, left eye, initial encounter: Secondary | ICD-10-CM | POA: Diagnosis not present

## 2023-02-24 DIAGNOSIS — X58XXXA Exposure to other specified factors, initial encounter: Secondary | ICD-10-CM | POA: Insufficient documentation

## 2023-02-24 DIAGNOSIS — H5789 Other specified disorders of eye and adnexa: Secondary | ICD-10-CM | POA: Diagnosis present

## 2023-02-24 MED ORDER — ERYTHROMYCIN 5 MG/GM OP OINT
TOPICAL_OINTMENT | Freq: Once | OPHTHALMIC | Status: AC
  Administered 2023-02-24: 1 via OPHTHALMIC
  Filled 2023-02-24: qty 3.5

## 2023-02-24 MED ORDER — TETRACAINE HCL 0.5 % OP SOLN
2.0000 [drp] | Freq: Once | OPHTHALMIC | Status: AC
  Administered 2023-02-24: 2 [drp] via OPHTHALMIC
  Filled 2023-02-24: qty 4

## 2023-02-24 MED ORDER — FLUORESCEIN SODIUM 1 MG OP STRP
1.0000 | ORAL_STRIP | Freq: Once | OPHTHALMIC | Status: AC
  Administered 2023-02-24: 1 via OPHTHALMIC
  Filled 2023-02-24: qty 1

## 2023-02-24 NOTE — ED Triage Notes (Signed)
Pt was working on sewing items and was using a needle to poke into super glue and felt it flick and something came back onto her eye. Pt reports she read online to rinse her eyes so she did. Pt LT eye looks red, no drainage. Pt reports it feels like there is something on her eye. Unable to visual glue on eye.

## 2023-02-24 NOTE — ED Provider Notes (Signed)
   [facility]  Provider Note  CSN: [id number] Arrival date & time: 02/24/23 0221  History Chief Complaint  Patient presents with   Eye Problem    Debra Pacheco is a 32 y.o. female reports she was using a tube of super glue while sewing earlier tonight when she things a small drop of the glue got 'flicked' into her L eye. She tried flushing it but has had irritation, redness and FB sensation since then. No blurry vision or purulent drainage. Does not wear glasses or contacts.    Home Medications Prior to Admission medications   Medication Sig Start Date End Date Taking? Authorizing Provider  ondansetron (ZOFRAN) 4 MG tablet Take 1 tablet (4 mg total) by mouth every 8 (eight) hours as needed for nausea or vomiting. 09/20/22   Hayden Rasmussen, MD     Allergies    Patient has no known allergies.   Review of Systems   Review of Systems Please see HPI for pertinent positives and negatives  Physical Exam BP 127/84   Pulse 100   Temp 98.8 F (37.1 C) (Oral)   Resp 18   Ht 5\' 4"  (1.626 m)   Wt 118 kg   LMP 02/17/2023 (Exact Date)   SpO2 100%   BMI 44.65 kg/m   Physical Exam Vitals and nursing note reviewed.  HENT:     Head: Normocephalic.     Nose: Nose normal.  Eyes:     Extraocular Movements: Extraocular movements intact.     Comments: R eye is normal. L eye with mild conjunctival injection. Anterior chamber is clear. No FB seen. Small punctate fluorescein uptake at 10 o'clock on fluorescein exam.   Pulmonary:     Effort: Pulmonary effort is normal.  Musculoskeletal:        General: Normal range of motion.     Cervical back: Neck supple.  Skin:    Findings: No rash (on exposed skin).  Neurological:     Mental Status: She is alert and oriented to person, place, and time.  Psychiatric:        Mood and Affect: Mood normal.     ED Results / Procedures / Treatments   EKG None  Procedures Procedures  Medications  Ordered in the ED Medications  tetracaine (PONTOCAINE) 0.5 % ophthalmic solution 2 drop (has no administration in time range)  fluorescein ophthalmic strip 1 strip (has no administration in time range)  erythromycin ophthalmic ointment (has no administration in time range)    Initial Impression and Plan  Patient here with possible super glue FB in L eye. None seen now, but small corneal abrasion may have resulted. Plan erythromycin ointment, outpatient Ophtho follow up, RTED for any other concerns.   ED Course       MDM Rules/Calculators/A&P Medical Decision Making Problems Addressed: Abrasion of left cornea, initial encounter: acute illness or injury  Risk Prescription drug management.     Final Clinical Impression(s) / ED Diagnoses Final diagnoses:  Abrasion of left cornea, initial encounter    Rx / DC Orders ED Discharge Orders     None        Truddie Hidden, MD 02/24/23 [phone]

## 2023-02-24 NOTE — Discharge Instructions (Signed)
Use the eye ointment, 1/2 in to the lower lid every 4-6 hours while awake until follow up.
